# Patient Record
Sex: Male | Born: 2000
Health system: Southern US, Community
[De-identification: ages and names within clinical notes are randomized; demographics above are authoritative.]

## PROBLEM LIST (undated history)

## (undated) HISTORY — PX: TOE SURGERY: SHX1073

---
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
## 2020-01-18 DIAGNOSIS — Z23 Encounter for immunization: Secondary | ICD-10-CM | POA: Diagnosis not present

## 2020-08-14 DIAGNOSIS — Z20822 Contact with and (suspected) exposure to covid-19: Secondary | ICD-10-CM | POA: Diagnosis not present

## 2021-03-27 ENCOUNTER — Ambulatory Visit
Admission: EM | Admit: 2021-03-27 | Discharge: 2021-03-27 | Disposition: A | Discharge status: Home / Self Care | Payer: 59 | Attending: Physician Assistant | Admitting: Physician Assistant

## 2021-03-27 ENCOUNTER — Other Ambulatory Visit: Payer: Self-pay

## 2021-03-27 DIAGNOSIS — J029 Acute pharyngitis, unspecified: Secondary | ICD-10-CM | POA: Insufficient documentation

## 2021-03-27 LAB — POCT RAPID STREP A (OFFICE): Rapid Strep A Screen: NEGATIVE

## 2021-03-27 NOTE — ED Triage Notes (Signed)
Pt presents with sore throat x 3 days.  States it feels like the strep throat he's had in the past.  No other s/s incl cough, HA.

## 2021-03-27 NOTE — ED Provider Notes (Signed)
[name]    CSN: [id number] Arrival date & time: 03/27/21  1126      History   Chief Complaint Chief Complaint  Patient presents with   Sore Throat    HPI Shane Davenport is a 21 y.o. male.   Patient here today for evaluation of sore throat that started 3 days ago. He states it feels similar to prior strep infections. He has not had fever. He denies any other symptoms. He has not taken any meds for symptoms.  The history is provided by the patient.  Sore Throat Pertinent negatives include no shortness of breath.   History reviewed. No pertinent past medical history.  Patient Active Problem List   Diagnosis Date Noted   Hip injury, left, subsequent encounter 05/13/2016   Recurrent epistaxis 04/24/2016   Deviated nasal septum 04/24/2016    History reviewed. No pertinent surgical history.     Home Medications    Prior to Admission medications   Medication Sig Start Date End Date Taking? Authorizing Provider  ADDERALL XR 30 MG 24 hr capsule  04/02/18  Yes [provider]  cetirizine (ZYRTEC ALLERGY) 10 MG tablet Take 1 tablet (10 mg total) by mouth daily. 07/16/19   Hall-Potvin, [place], PA-C  fluticasone (FLONASE) 50 MCG/ACT nasal spray Place 1 spray into both nostrils daily. 07/16/19   Hall-Potvin, [place], PA-C  ibuprofen (ADVIL,MOTRIN) 200 MG tablet Take 200 mg by mouth every 6 (six) hours as needed.    [provider]    Family History Family History  Problem Relation Age of Onset   Healthy Mother    Healthy Father     Social History Social History   Tobacco Use   Smoking status: Never   Smokeless tobacco: Never  Substance Use Topics   Alcohol use: Never   Drug use: Never     Allergies   Patient has no known allergies.   Review of Systems Review of Systems  Constitutional:  Negative for chills and fever.  HENT:  Positive for sore throat. Negative for congestion.   Eyes:  Negative for discharge and redness.   Respiratory:  Negative for cough and shortness of breath.   Gastrointestinal:  Negative for nausea and vomiting.    Physical Exam Triage Vital Signs ED Triage Vitals  Enc Vitals Group     BP 03/27/21 1256 116/69     Pulse Rate 03/27/21 1256 96     Resp 03/27/21 1256 18     Temp 03/27/21 1256 98.4 F (36.9 C)     Temp Source 03/27/21 1256 Oral     SpO2 03/27/21 1256 96 %     Weight --      Height --      Head Circumference --      Peak Flow --      Pain Score 03/27/21 1252 6     Pain Loc --      Pain Edu? --      Excl. in GC? --    No data found.  Updated Vital Signs BP 116/69 (BP Location: Left Arm)    Pulse 96    Temp 98.4 F (36.9 C) (Oral)    Resp 18    SpO2 96%   Physical Exam Vitals and nursing note reviewed.  Constitutional:      General: He is not in acute distress.    Appearance: He is well-developed. He is not ill-appearing.  HENT:     Head: Normocephalic and atraumatic.  Mouth/Throat:     Mouth: Mucous membranes are dry.     Pharynx: Posterior oropharyngeal erythema present.  Cardiovascular:     Rate and Rhythm: Normal rate.  Pulmonary:     Effort: Pulmonary effort is normal.  Neurological:     Mental Status: He is alert.     UC Treatments / Results  Labs (all labs ordered are listed, but only abnormal results are displayed) Labs Reviewed  CULTURE, GROUP A STREP [facility])  POCT RAPID STREP A (OFFICE)    EKG   Radiology No results found.  Procedures Procedures (including critical care time)  Medications Ordered in UC Medications - No data to display  Initial Impression / Assessment and Plan / UC Course  I have reviewed the triage vital signs and the nursing notes.  Pertinent labs & imaging results that were available during my care of the patient were reviewed by me and considered in my medical decision making (see chart for details).   Strep test negative. Will order culture. Recommend symptomatic treatment and follow up if  symptoms fail to improve or worsen.    Final Clinical Impressions(s) / UC Diagnoses   Final diagnoses:  Acute pharyngitis, unspecified etiology   Discharge Instructions   None    ED Prescriptions   None    PDMP not reviewed this encounter.   Tomi Bamberger, PA-C 03/27/21 1437

## 2021-03-28 ENCOUNTER — Telehealth: Payer: 59 | Admitting: Physician Assistant

## 2021-03-28 ENCOUNTER — Other Ambulatory Visit (HOSPITAL_COMMUNITY): Payer: Self-pay

## 2021-03-28 DIAGNOSIS — J029 Acute pharyngitis, unspecified: Secondary | ICD-10-CM

## 2021-03-28 DIAGNOSIS — H2511 Age-related nuclear cataract, right eye: Secondary | ICD-10-CM | POA: Diagnosis not present

## 2021-03-28 MED ORDER — AMOXICILLIN 500 MG PO CAPS
500.0000 mg | ORAL_CAPSULE | Freq: Two times a day (BID) | ORAL | 0 refills | Status: AC
  Filled 2021-03-28: qty 20, 10d supply, fill #0

## 2021-03-28 NOTE — Progress Notes (Signed)
E-Visit for Sore Throat - Strep Symptoms  We are sorry that you are not feeling well.  Here is how we plan to help!  Based on what you have shared with me it is likely that you have pharyngitis.  Pharyngitis is inflammation and infection in the back of the throat.  This is an infection that can be caused by bacteria and is treated with antibiotics.  I am worried about a secondary ear infection as well. To cover everything, I have prescribed Amoxicillin 500 mg twice a day for 10 days. For throat pain, we recommend over the counter oral pain relief medications such as acetaminophen or aspirin, or anti-inflammatory medications such as ibuprofen or naproxen sodium. Topical treatments such as oral throat lozenges or sprays may be used as needed. Strep infections are not as easily transmitted as other respiratory infections, however we still recommend that you avoid close contact with loved ones, especially the very young and elderly.  Remember to wash your hands thoroughly throughout the day as this is the number one way to prevent the spread of infection and wipe down door knobs and counters with disinfectant.   Home Care: Only take medications as instructed by your medical team. Complete the entire course of an antibiotic. Do not take these medications with alcohol. A steam or ultrasonic humidifier can help congestion.  You can place a towel over your head and breathe in the steam from hot water coming from a faucet. Avoid close contacts especially the very young and the elderly. Cover your mouth when you cough or sneeze. Always remember to wash your hands.  Get Help Right Away If: You develop worsening fever or sinus pain. You develop a severe head ache or visual changes. Your symptoms persist after you have completed your treatment plan.  Make sure you Understand these instructions. Will watch your condition. Will get help right away if you are not doing well or get worse.   Thank you for  choosing an e-visit.  Your e-visit answers were reviewed by a board certified advanced clinical practitioner to complete your personal care plan. Depending upon the condition, your plan could have included both over the counter or prescription medications.  Please review your pharmacy choice. Make sure the pharmacy is open so you can pick up prescription now. If there is a problem, you may contact your provider through [employer] and have the prescription routed to another pharmacy.  Your safety is important to [place]. If you have drug allergies check your prescription carefully.   For the next 24 hours you can use MyChart to ask questions about today's visit, request a non-urgent call back, or ask for a work or school excuse. You will get an email in the next two days asking about your experience. I hope that your e-visit has been valuable and will speed your recovery.

## 2021-03-28 NOTE — Progress Notes (Signed)
I have spent 5 minutes in review of e-visit questionnaire, review and updating patient chart, medical decision making and response to patient.   Shane Propes Cody Marcina Kinnison, PA-C    

## 2021-03-29 LAB — CULTURE, GROUP A STREP (THRC)

## 2021-04-03 ENCOUNTER — Other Ambulatory Visit (HOSPITAL_COMMUNITY): Payer: Self-pay

## 2021-04-03 MED ORDER — FLUTICASONE PROPIONATE 50 MCG/ACT NA SUSP
2.0000 | Freq: Two times a day (BID) | NASAL | 0 refills | Status: DC | PRN
  Filled 2021-04-03: qty 16, 30d supply, fill #0

## 2021-04-03 MED ORDER — IPRATROPIUM BROMIDE 0.06 % NA SOLN
2.0000 | Freq: Four times a day (QID) | NASAL | 0 refills | Status: DC
  Filled 2021-04-03: qty 15, 19d supply, fill #0

## 2021-07-08 ENCOUNTER — Encounter (HOSPITAL_COMMUNITY): Payer: Self-pay | Admitting: Emergency Medicine

## 2021-07-08 ENCOUNTER — Emergency Department (HOSPITAL_COMMUNITY): Payer: 59

## 2021-07-08 ENCOUNTER — Emergency Department (HOSPITAL_COMMUNITY)
Admission: EM | Admit: 2021-07-08 | Discharge: 2021-07-08 | Disposition: A | Discharge status: Home / Self Care | Payer: 59 | Attending: Emergency Medicine | Admitting: Emergency Medicine

## 2021-07-08 DIAGNOSIS — N132 Hydronephrosis with renal and ureteral calculous obstruction: Secondary | ICD-10-CM | POA: Insufficient documentation

## 2021-07-08 DIAGNOSIS — N3289 Other specified disorders of bladder: Secondary | ICD-10-CM | POA: Diagnosis not present

## 2021-07-08 DIAGNOSIS — R109 Unspecified abdominal pain: Secondary | ICD-10-CM | POA: Diagnosis present

## 2021-07-08 DIAGNOSIS — N201 Calculus of ureter: Secondary | ICD-10-CM

## 2021-07-08 DIAGNOSIS — N130 Hydronephrosis with ureteropelvic junction obstruction: Secondary | ICD-10-CM | POA: Diagnosis not present

## 2021-07-08 LAB — URINALYSIS, ROUTINE W REFLEX MICROSCOPIC
Bacteria, UA: NONE SEEN
Bilirubin Urine: NEGATIVE
Glucose, UA: NEGATIVE mg/dL
Ketones, ur: 20 mg/dL — AB
Leukocytes,Ua: NEGATIVE
Nitrite: NEGATIVE
Protein, ur: NEGATIVE mg/dL
RBC / HPF: >50 RBC/hpf — ABNORMAL HIGH (ref 0–5)
Specific Gravity, Urine: 1.024 (ref 1.005–1.030)
pH: 5 (ref 5.0–8.0)

## 2021-07-08 LAB — BASIC METABOLIC PANEL
Anion gap: 7 (ref 5–15)
BUN: 15 mg/dL (ref 6–20)
CO2: 25 mmol/L (ref 22–32)
Calcium: 9 mg/dL (ref 8.9–10.3)
Chloride: 104 mmol/L (ref 98–111)
Creatinine, Ser: 1.17 mg/dL (ref 0.61–1.24)
GFR, Estimated: >60 mL/min (ref >60)
Glucose, Bld: 104 mg/dL — ABNORMAL HIGH (ref 70–99)
Potassium: 4.2 mmol/L (ref 3.5–5.1)
Sodium: 136 mmol/L (ref 135–145)

## 2021-07-08 LAB — CBC WITH DIFFERENTIAL/PLATELET
Abs Immature Granulocytes: 0.02 10*3/uL (ref 0.00–0.07)
Basophils Absolute: 0 10*3/uL (ref 0.0–0.1)
Basophils Relative: 0 %
Eosinophils Absolute: 0 10*3/uL (ref 0.0–0.5)
Eosinophils Relative: 0 %
HCT: 44.7 % (ref 39.0–52.0)
Hemoglobin: 14.7 g/dL (ref 13.0–17.0)
Immature Granulocytes: 0 %
Lymphocytes Relative: 9 %
Lymphs Abs: 0.8 10*3/uL (ref 0.7–4.0)
MCH: 28.3 pg (ref 26.0–34.0)
MCHC: 32.9 g/dL (ref 30.0–36.0)
MCV: 86 fL (ref 80.0–100.0)
Monocytes Absolute: 0.5 10*3/uL (ref 0.1–1.0)
Monocytes Relative: 6 %
Neutro Abs: 7.8 10*3/uL — ABNORMAL HIGH (ref 1.7–7.7)
Neutrophils Relative %: 85 %
Platelets: 248 10*3/uL (ref 150–400)
RBC: 5.2 MIL/uL (ref 4.22–5.81)
RDW: 13.4 % (ref 11.5–15.5)
WBC: 9.1 10*3/uL (ref 4.0–10.5)
nRBC: 0 % (ref 0.0–0.2)

## 2021-07-08 MED ORDER — KETOROLAC TROMETHAMINE 15 MG/ML IJ SOLN
15.0000 mg | Freq: Once | INTRAMUSCULAR | Status: AC
  Administered 2021-07-08: 15 mg via INTRAVENOUS
  Filled 2021-07-08: qty 1

## 2021-07-08 MED ORDER — OXYCODONE-ACETAMINOPHEN 5-325 MG PO TABS
2.0000 | ORAL_TABLET | Freq: Once | ORAL | Status: AC
  Administered 2021-07-08: 2 via ORAL
  Filled 2021-07-08: qty 2

## 2021-07-08 MED ORDER — ONDANSETRON HCL 4 MG/2ML IJ SOLN
4.0000 mg | Freq: Once | INTRAMUSCULAR | Status: AC
Start: 2021-07-08 — End: 2021-07-08
  Administered 2021-07-08: 4 mg via INTRAVENOUS
  Filled 2021-07-08: qty 2

## 2021-07-08 MED ORDER — LACTATED RINGERS IV BOLUS
1000.0000 mL | Freq: Once | INTRAVENOUS | Status: AC
  Administered 2021-07-08: 1000 mL via INTRAVENOUS

## 2021-07-08 NOTE — ED Provider Triage Note (Signed)
Emergency Medicine Provider Triage Evaluation Note ? ?Shane Davenport , a 21 y.o. male  was evaluated in triage.  Pt complains of right flank pain  ? ?Review of Systems  ?Positive: Back pain  ?Negative: No fever  ? ?Physical Exam  ?BP (!) 126/107 (BP Location: Left Arm)   Pulse 71   Temp 97.9 ?F (36.6 ?C) (Oral)   Resp 18   SpO2 100%  ?Gen:   Awake, no distress   ?Resp:  Normal effort  ?MSK:   Moves extremities without difficulty  ?Other:  Tender right flank ? ?Medical Decision Making  ?Medically screening exam initiated at 7:40 PM.  Appropriate orders placed.  Shane Davenport was informed that the remainder of the evaluation will be completed by another provider, this initial triage assessment does not replace that evaluation, and the importance of remaining in the ED until their evaluation is complete. ? ? ?  ?Elson Areas, [place] ?07/08/21 1941 ? ?

## 2021-07-08 NOTE — ED Provider Notes (Signed)
?[facility]-EMERGENCY DEPT ?Provider Note ? ? ?CSN: [id number] ?Arrival date & time: 07/08/21  1814 ? ?  ? ?History ? ?Chief Complaint  ?Patient presents with  ? Flank Pain  ? ? ?Shane Davenport is a 21 y.o. male. ? ? ?Flank Pain ? ? ?21 year old male with a history of nephrolithiasis presenting to the emergency department with right-sided flank pain.  The patient states that he had sudden onset sharp severe right-sided flank pain with associated nausea and vomiting.  He is try to orally hydrate at home but was unable to keep anything down.  He states that his pain feels similar to prior kidney stones.  He denies any fever or chills.  He denies any dysuria or increased urinary frequency. ? ?Home Medications ?Prior to Admission medications   ?Medication Sig Start Date End Date Taking? Authorizing Provider  ?ADDERALL XR 30 MG 24 hr capsule  04/02/18   [provider]  ?cetirizine (ZYRTEC ALLERGY) 10 MG tablet Take 1 tablet (10 mg total) by mouth daily. 07/16/19   Hall-Potvin, [place], PA-C  ?fluticasone (FLONASE) 50 MCG/ACT nasal spray Place 1 spray into both nostrils daily. 07/16/19   Hall-Potvin, [place], PA-C  ?fluticasone (FLONASE) 50 MCG/ACT nasal spray Place 1 spray into both nostrils every 12 (twelve) hours as needed. 04/02/21     ?ibuprofen (ADVIL,MOTRIN) 200 MG tablet Take 200 mg by mouth every 6 (six) hours as needed.    [provider]  ?ipratropium (ATROVENT) 0.06 % nasal spray Place 2 sprays into both nostrils 4 (four) times daily until directed to stop 04/02/21     ?   ? ?Allergies    ?Patient has no known allergies.   ? ?Review of Systems   ?Review of Systems  ?Genitourinary:  Positive for flank pain.  ?All other systems reviewed and are negative. ? ?Physical Exam ?Updated Vital Signs ?BP (!) 145/63   Pulse 69   Temp 97.9 ?F (36.6 ?C) (Oral)   Resp 18   SpO2 100%  ?Physical Exam ?Vitals and nursing note reviewed.  ?Constitutional:   ?   General: He is not in acute  distress. ?HENT:  ?   Head: Normocephalic and atraumatic.  ?Eyes:  ?   Conjunctiva/sclera: Conjunctivae normal.  ?   Pupils: Pupils are equal, round, and reactive to light.  ?Cardiovascular:  ?   Rate and Rhythm: Normal rate and regular rhythm.  ?Pulmonary:  ?   Effort: Pulmonary effort is normal. No respiratory distress.  ?Abdominal:  ?   General: There is no distension.  ?   Tenderness: There is no abdominal tenderness. There is right CVA tenderness. There is no guarding.  ?Musculoskeletal:     ?   General: No deformity or signs of injury.  ?   Cervical back: Neck supple.  ?Skin: ?   Findings: No lesion or rash.  ?Neurological:  ?   General: No focal deficit present.  ?   Mental Status: He is alert. Mental status is at baseline.  ? ? ?ED Results / Procedures / Treatments   ?Labs ?(all labs ordered are listed, but only abnormal results are displayed) ?Labs Reviewed  ?URINALYSIS, ROUTINE W REFLEX MICROSCOPIC - Abnormal; Notable for the following components:  ?    Result Value  ? Hgb urine dipstick MODERATE (*)   ? Ketones, ur 20 (*)   ? RBC / HPF >50 (*)   ? All other components within normal limits  ?CBC WITH DIFFERENTIAL/PLATELET - Abnormal; Notable for  the following components:  ? Neutro Abs 7.8 (*)   ? All other components within normal limits  ?BASIC METABOLIC PANEL - Abnormal; Notable for the following components:  ? Glucose, Bld 104 (*)   ? All other components within normal limits  ? ? ?EKG ?None ? ?Radiology ?CT Renal Stone Study ? ?Result Date: 07/08/2021 ?CLINICAL DATA:  Flank pain, kidney stone suspected EXAM: CT ABDOMEN AND PELVIS WITHOUT CONTRAST TECHNIQUE: Multidetector CT imaging of the abdomen and pelvis was performed following the standard protocol without IV contrast. RADIATION DOSE REDUCTION: This exam was performed according to the departmental dose-optimization program which includes automated exposure control, adjustment of the mA and/or kV according to patient size and/or use of iterative  reconstruction technique. COMPARISON:  None. FINDINGS: Lower chest: No acute abnormality. Hepatobiliary: No focal liver abnormality is seen. The gallbladder is unremarkable. Pancreas: Unremarkable. No pancreatic ductal dilatation or surrounding inflammatory changes. Spleen: Normal in size without focal abnormality. Adrenals/Urinary Tract: Adrenal glands are unremarkable. There is right-sided hydroureteronephrosis with mild perinephric and periureteral stranding due to an obstructing 3 mm stone at the right ureterovesicular junction. Bladder is minimally distended. Stomach/Bowel: The stomach is within normal limits. There is no evidence of bowel obstruction.The appendix is normal. Vascular/Lymphatic: No significant vascular findings are present. No enlarged abdominal or pelvic lymph nodes. Reproductive: Unremarkable. Other: No abdominal wall hernia or abnormality. No abdominopelvic ascites. Musculoskeletal: No acute or significant osseous findings. IMPRESSION: Obstructing 3 mm stone at the right ureterovesicular junction, with upstream mild right hydroureteronephrosis, perinephric and periureteral stranding. Electronically Signed   By: Caprice Renshaw M.D.   On: 07/08/2021 19:52   ? ?Procedures ?Procedures  ? ? ?Medications Ordered in ED ?Medications  ?oxyCODONE-acetaminophen (PERCOCET/ROXICET) 5-325 MG per tablet 2 tablet (2 tablets Oral Given 07/08/21 1945)  ?ketorolac (TORADOL) 15 MG/ML injection 15 mg (15 mg Intravenous Given 07/08/21 2024)  ?lactated ringers bolus 1,000 mL (1,000 mLs Intravenous New Bag/Given 07/08/21 2025)  ?ondansetron [facility]) injection 4 mg (4 mg Intravenous Given 07/08/21 2033)  ? ? ?ED Course/ Medical Decision Making/ A&P ?  ?                        ?Medical Decision Making ?Amount and/or Complexity of Data Reviewed ?Labs: ordered. ? ?Risk ?Prescription drug management. ? ? ?21 year old male with a history of nephrolithiasis presenting to the emergency department with right-sided flank pain.  The  patient states that he had sudden onset sharp severe right-sided flank pain with associated nausea and vomiting.  He is try to orally hydrate at home but was unable to keep anything down.  He states that his pain feels similar to prior kidney stones.  He denies any fever or chills.  He denies any dysuria or increased urinary frequency. ? ?Concern for nephrolithiasis.  No dysuria or increased urinary frequency.  No fevers or chills.  No CVA tenderness or right lower quadrant tenderness on exam. ? ?CT abdomen pelvis was performed which revealed a 3 mm stone at the right UPJ.  The patient had subsequent pain relief on administration of an IV fluid bolus and IV Toradol.  Suspect likely passage of his stone.  Urinalysis was without evidence of urinary tract infection.  Laboratory work-up without leukocytosis or anemia, BMP without evidence of AKI.  Patient symptomatically improved on reassessment, recommended NSAIDs continued oral hydration outpatient, follow-up with urology in clinic. ? ?Final Clinical Impression(s) / ED Diagnoses ?Final diagnoses:  ?Ureterolithiasis  ? ? ?Rx /  DC Orders ?ED Discharge Orders   ? ?      Ordered  ?  Ambulatory referral to Urology       ? 07/08/21 2146  ? ?  ?  ? ?  ? ? ?  ?Ernie Avena, MD ?07/08/21 2147 ? ?

## 2021-07-08 NOTE — ED Triage Notes (Signed)
Patient c/o R flank pain x5 hours. Family history of kidney stones per patient. ?

## 2021-07-08 NOTE — Discharge Instructions (Addendum)
You were evaluated in the Emergency Department and after careful evaluation, we did not find any emergent condition requiring admission or further testing in the hospital. ? ?Your exam/testing today was [place] for a kidney stone in the right UPJ junction.  Your pain has improved likely indicating passage of stone.  Your kidney function was normal.  Urinalysis was without evidence of urinary tract infection.  If you have persistent discomfort, recommend NSAIDs for pain control.  Continue to hydrate at home with lots of fluid intake.  Follow-up with urology outpatient. ? ?Please return to the Emergency Department if you experience any worsening of your condition.  Thank you for allowing [place] to be a part of your care. ? ?

## 2022-11-03 ENCOUNTER — Emergency Department (HOSPITAL_COMMUNITY): Payer: 59

## 2022-11-03 ENCOUNTER — Emergency Department (HOSPITAL_COMMUNITY)
Admission: EM | Admit: 2022-11-03 | Discharge: 2022-11-04 | Disposition: A | Discharge status: Home / Self Care | Payer: 59 | Attending: Emergency Medicine | Admitting: Emergency Medicine

## 2022-11-03 ENCOUNTER — Encounter (HOSPITAL_COMMUNITY): Payer: Self-pay

## 2022-11-03 ENCOUNTER — Other Ambulatory Visit: Payer: Self-pay

## 2022-11-03 DIAGNOSIS — N201 Calculus of ureter: Secondary | ICD-10-CM | POA: Diagnosis not present

## 2022-11-03 DIAGNOSIS — N200 Calculus of kidney: Secondary | ICD-10-CM | POA: Diagnosis not present

## 2022-11-03 DIAGNOSIS — R109 Unspecified abdominal pain: Secondary | ICD-10-CM | POA: Diagnosis not present

## 2022-11-03 MED ORDER — ONDANSETRON HCL 4 MG/2ML IJ SOLN
4.0000 mg | Freq: Once | INTRAMUSCULAR | Status: AC
  Administered 2022-11-04: 4 mg via INTRAVENOUS
  Filled 2022-11-03: qty 2

## 2022-11-03 MED ORDER — KETOROLAC TROMETHAMINE 15 MG/ML IJ SOLN
15.0000 mg | Freq: Once | INTRAMUSCULAR | Status: AC
  Administered 2022-11-04: 15 mg via INTRAVENOUS
  Filled 2022-11-03: qty 1

## 2022-11-03 MED ORDER — SODIUM CHLORIDE 0.9 % IV BOLUS
1000.0000 mL | Freq: Once | INTRAVENOUS | Status: AC
  Administered 2022-11-04: 1000 mL via INTRAVENOUS

## 2022-11-03 NOTE — ED Provider Notes (Signed)
[facility] EMERGENCY DEPARTMENT AT [facility] Provider Note   CSN: [id number] Arrival date & time: 11/03/22  2105     History {Add pertinent medical, surgical, social history, OB history to HPI:1} Chief Complaint  Patient presents with   Flank Pain    Shane Davenport is a 22 y.o. male.  22 year old male presents to the emergency department for evaluation of left-sided flank pain.  Pain is nonradiating and was sudden in onset.  Has been constant since 1930.  Worsening pain provoking nausea and vomiting.  He has had some emesis while in the waiting room.  States that his pain feels similar to when he was diagnosed with a kidney stone 1.5 years ago.  He never followed up with urology after this diagnosis; believes he passed this stone on his own.  No prior abdominal surgeries.  No medications taken prior to arrival.  The history is provided by the patient and a parent. No language interpreter was used.  Flank Pain       Home Medications Prior to Admission medications   Medication Sig Start Date End Date Taking? Authorizing Provider  ADDERALL XR 30 MG 24 hr capsule  04/02/18   [provider]  cetirizine (ZYRTEC ALLERGY) 10 MG tablet Take 1 tablet (10 mg total) by mouth daily. 07/16/19   Hall-Potvin, [place], PA-C  fluticasone (FLONASE) 50 MCG/ACT nasal spray Place 1 spray into both nostrils daily. 07/16/19   Hall-Potvin, [place], PA-C  fluticasone (FLONASE) 50 MCG/ACT nasal spray Place 1 spray into both nostrils every 12 (twelve) hours as needed. 04/02/21     ibuprofen (ADVIL,MOTRIN) 200 MG tablet Take 200 mg by mouth every 6 (six) hours as needed.    [provider]  ipratropium (ATROVENT) 0.06 % nasal spray Place 2 sprays into both nostrils 4 (four) times daily until directed to stop 04/02/21         Allergies    Patient has no known allergies.    Review of Systems   Review of Systems  Genitourinary:  Positive for flank pain.  Ten systems reviewed and  are negative for acute change, except as noted in the HPI.    Physical Exam Updated Vital Signs BP (!) 144/92 (BP Location: Right Arm)   Pulse 73   Temp 98.4 F (36.9 C) (Oral)   Resp 18   SpO2 97%   Physical Exam Vitals and nursing note reviewed.  Constitutional:      General: He is not in acute distress.    Appearance: He is well-developed. He is not diaphoretic.     Comments: Nontoxic-appearing and in no acute distress  HENT:     Head: Normocephalic and atraumatic.  Eyes:     General: No scleral icterus.    Conjunctiva/sclera: Conjunctivae normal.  Cardiovascular:     Rate and Rhythm: Normal rate and regular rhythm.     Pulses: Normal pulses.  Pulmonary:     Effort: Pulmonary effort is normal. No respiratory distress.     Comments: Respirations even and unlabored Abdominal:     Comments: Soft, obese abdomen.  No reproducible abdominal tenderness  Musculoskeletal:        General: Normal range of motion.     Cervical back: Normal range of motion.  Skin:    General: Skin is warm and dry.     Coloration: Skin is not pale.     Findings: No erythema or rash.  Neurological:     Mental Status: He is alert  and oriented to person, place, and time.     Coordination: Coordination normal.  Psychiatric:        Behavior: Behavior normal.     ED Results / Procedures / Treatments   Labs (all labs ordered are listed, but only abnormal results are displayed) Labs Reviewed  URINALYSIS, ROUTINE W REFLEX MICROSCOPIC    EKG None  Radiology No results found.  Procedures Procedures  {Document cardiac monitor, telemetry assessment procedure when appropriate:1}  Medications Ordered in ED Medications - No data to display  ED Course/ Medical Decision Making/ A&P   {   Click here for ABCD2, HEART and other calculatorsREFRESH Note before signing :1}                              Medical Decision Making Amount and/or Complexity of Data Reviewed Labs: ordered. Radiology:  ordered.   ***  {Document critical care time when appropriate:1} {Document review of labs and clinical decision tools ie heart score, Chads2Vasc2 etc:1}  {Document your independent review of radiology images, and any outside records:1} {Document your discussion with family members, caretakers, and with consultants:1} {Document social determinants of health affecting pt's care:1} {Document your decision making why or why not admission, treatments were needed:1} Final Clinical Impression(s) / ED Diagnoses Final diagnoses:  None    Rx / DC Orders ED Discharge Orders     None

## 2022-11-03 NOTE — ED Triage Notes (Signed)
Pt presents via POV c/o left sided flank pain and vomiting for the last few hours. Reports hx of kidney stones. Pt actively vomiting in triage.

## 2022-11-04 ENCOUNTER — Other Ambulatory Visit: Payer: Self-pay

## 2022-11-04 ENCOUNTER — Other Ambulatory Visit (HOSPITAL_COMMUNITY): Payer: Self-pay

## 2022-11-04 DIAGNOSIS — N201 Calculus of ureter: Secondary | ICD-10-CM | POA: Diagnosis not present

## 2022-11-04 DIAGNOSIS — R109 Unspecified abdominal pain: Secondary | ICD-10-CM | POA: Diagnosis not present

## 2022-11-04 MED ORDER — OXYCODONE-ACETAMINOPHEN 5-325 MG PO TABS
1.0000 | ORAL_TABLET | Freq: Once | ORAL | Status: AC
  Administered 2022-11-04: 1 via ORAL
  Filled 2022-11-04: qty 1

## 2022-11-04 MED ORDER — TAMSULOSIN HCL 0.4 MG PO CAPS
0.4000 mg | ORAL_CAPSULE | Freq: Every day | ORAL | 0 refills | Status: DC
  Filled 2022-11-04 (×2): qty 10, 10d supply, fill #0

## 2022-11-04 MED ORDER — ONDANSETRON 4 MG PO TBDP
4.0000 mg | ORAL_TABLET | Freq: Three times a day (TID) | ORAL | 0 refills | Status: DC | PRN
  Filled 2022-11-04 (×2): qty 10, 4d supply, fill #0

## 2022-11-04 MED ORDER — OXYCODONE-ACETAMINOPHEN 5-325 MG PO TABS
1.0000 | ORAL_TABLET | Freq: Four times a day (QID) | ORAL | 0 refills | Status: DC | PRN
  Filled 2022-11-04 (×2): qty 13, 4d supply, fill #0

## 2022-11-04 NOTE — Discharge Instructions (Signed)
We recommend that you take Flomax to promote stone movement.  Utilize 600mg  ibuprofen every 6 hours for pain control.  You have also been prescribed Percocet to take for management of severe pain.  Do not drive or drink alcohol after taking this medication as it may make you drowsy and impair your judgment.  If you continue to have persistent pain from your kidney stone, we advise follow-up with urology.  You may also return to the ED for new or concerning symptoms.

## 2023-01-10 IMAGING — CT CT RENAL STONE PROTOCOL
2 of 4 series · 17 of 46 positions shown, 19 images · non-contrast
Comparison: None.

CLINICAL DATA: Flank pain, kidney stone suspected



[Series 2: axial st · axial · 0.87mm/px · z∈[+1312,+1722]mm · 14 of 94 slices shown, 16 images]
[im 6/94  soft-tissue]
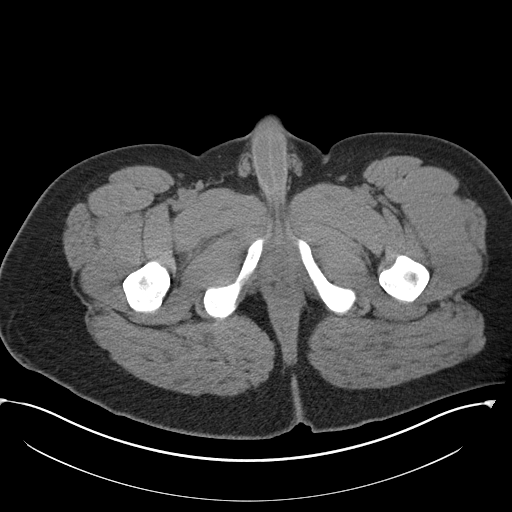
[im 6/94  bone]
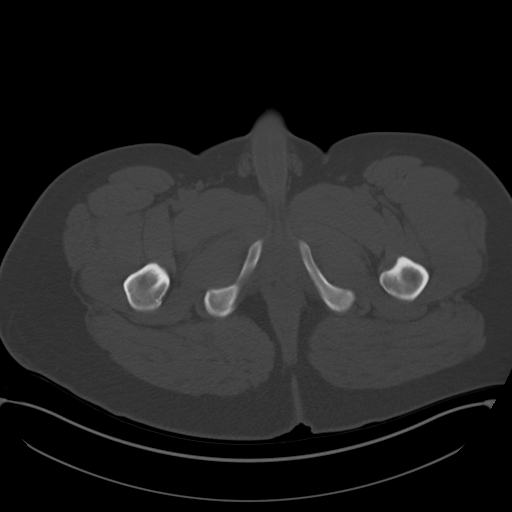
[im 11/94  soft-tissue]
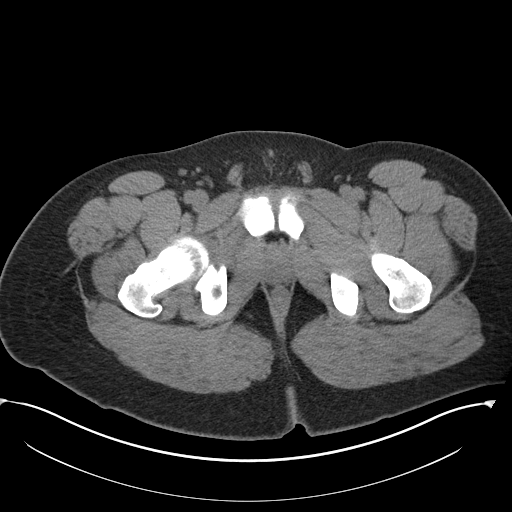
[im 21/94  soft-tissue]
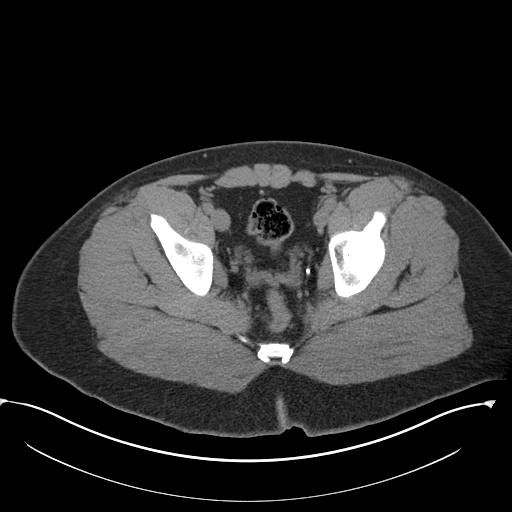
[im 26/94  soft-tissue]
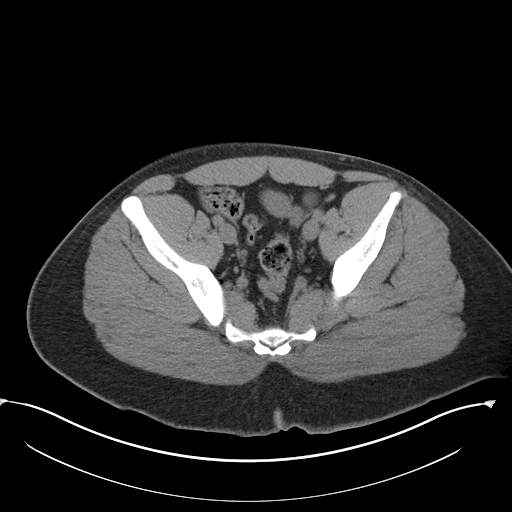
[im 32/94  soft-tissue]
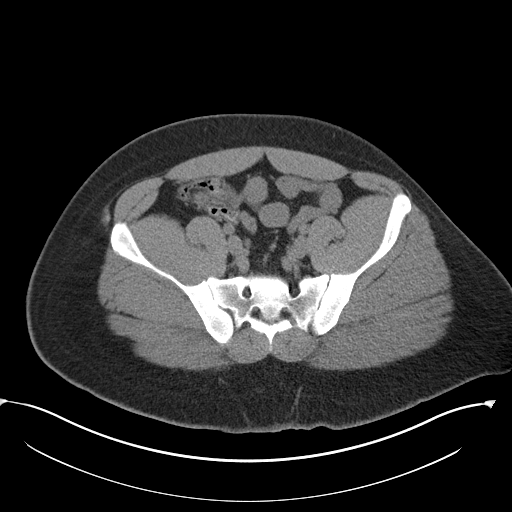
[im 37/94  soft-tissue]
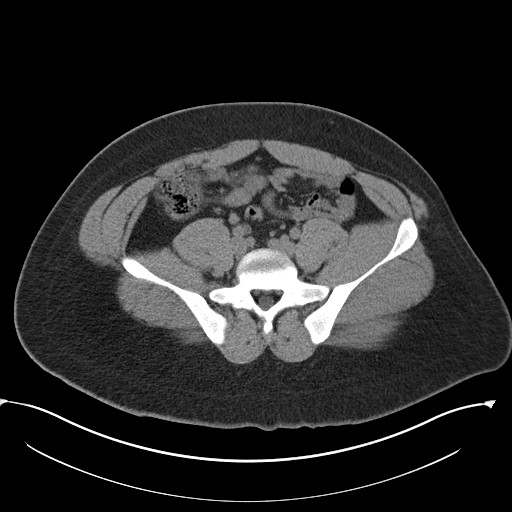
[im 42/94  soft-tissue]
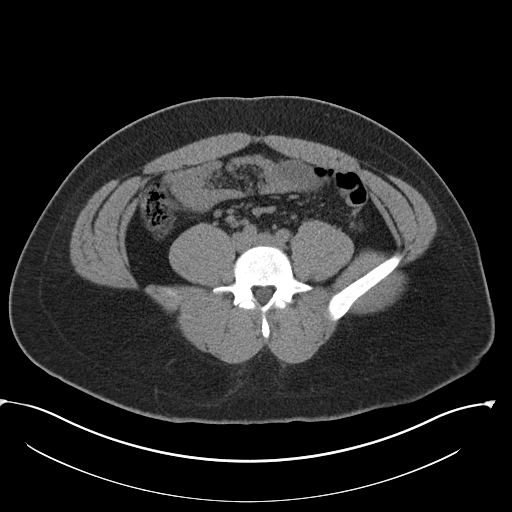
[im 52/94  soft-tissue]
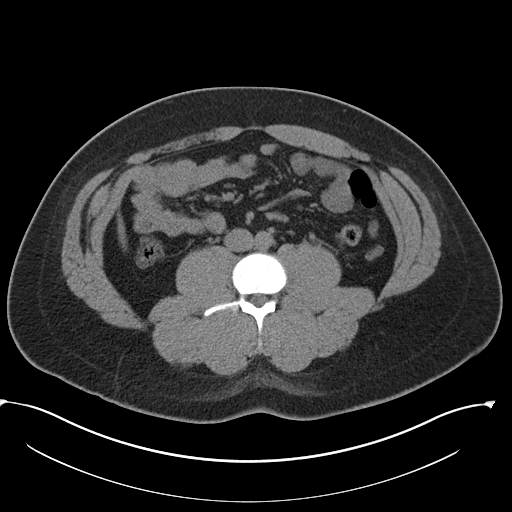
[im 57/94  soft-tissue]
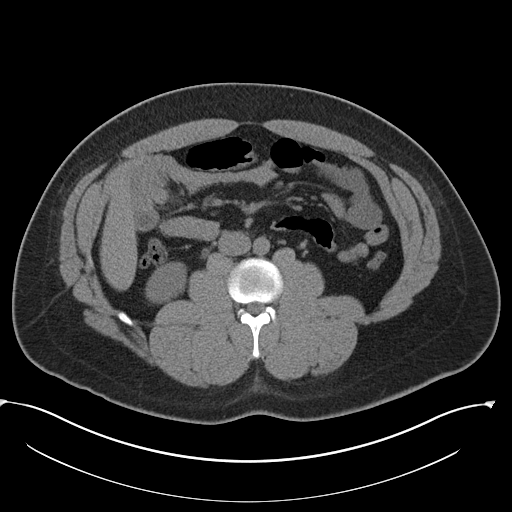
[im 57/94  bone]
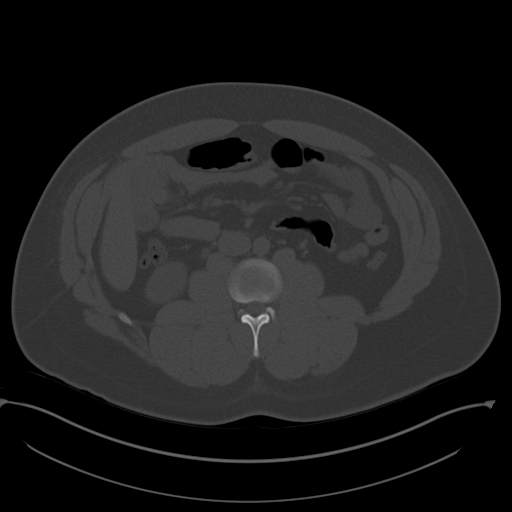
[im 63/94  soft-tissue]
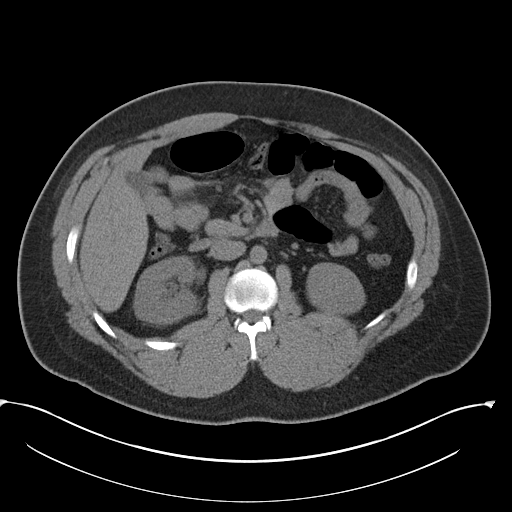
[im 68/94  soft-tissue]
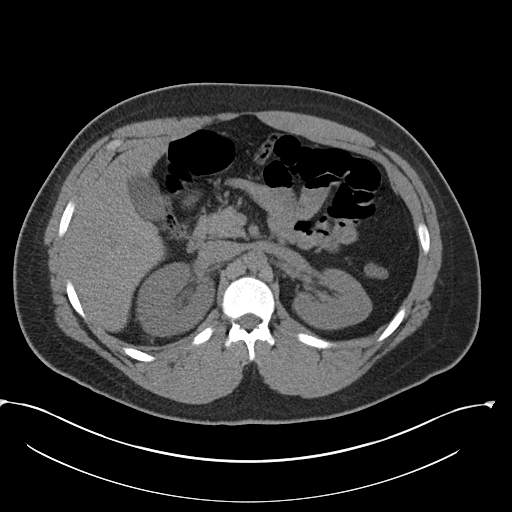
[im 73/94  soft-tissue]
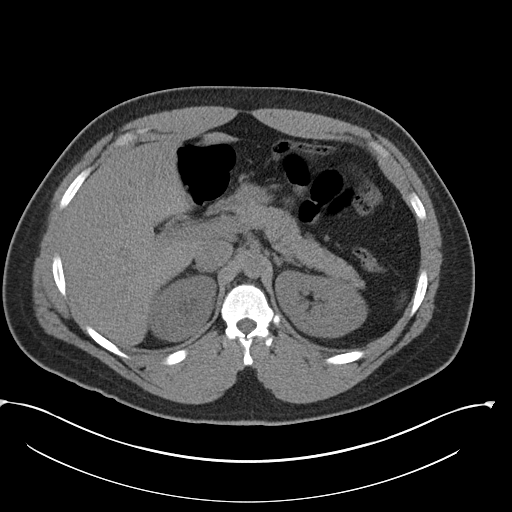
[im 83/94  soft-tissue]
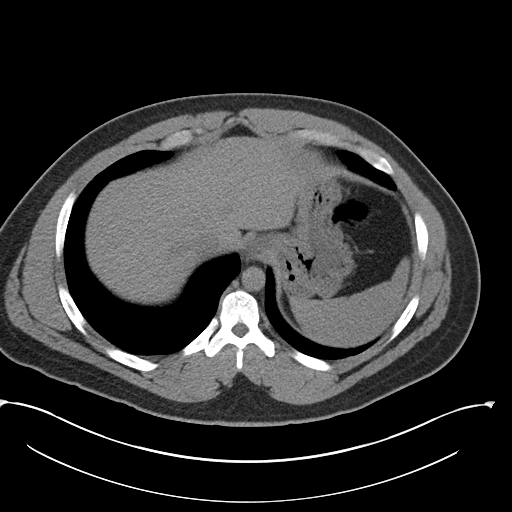
[im 88/94  soft-tissue]
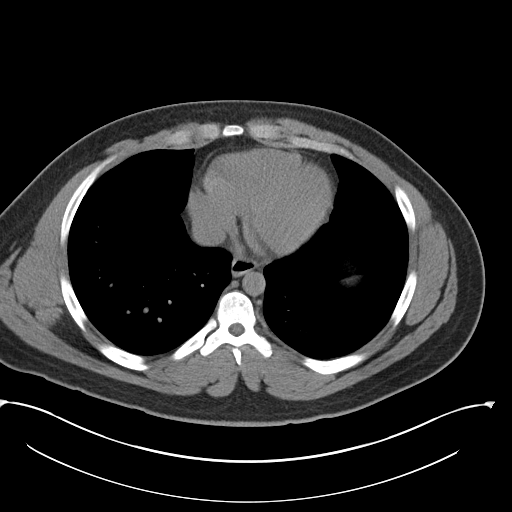

[Series 5: coronal · coronal · 0.92mm/px · 3 of 157 slices shown]
[im 53/157  soft-tissue]
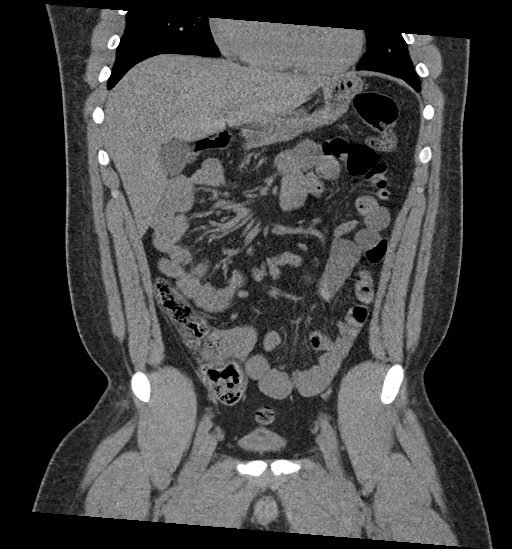
[im 70/157  soft-tissue]
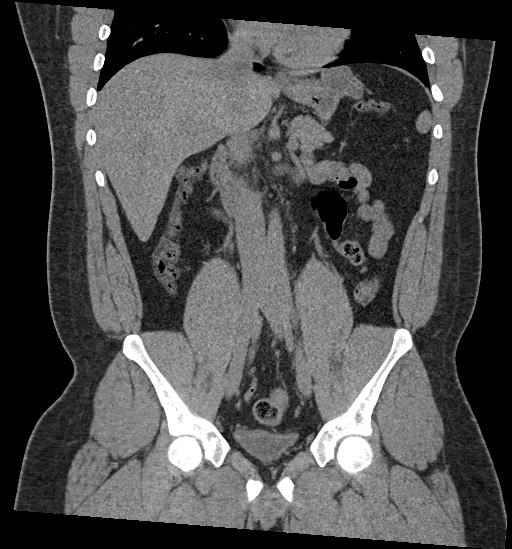
[im 87/157  soft-tissue]
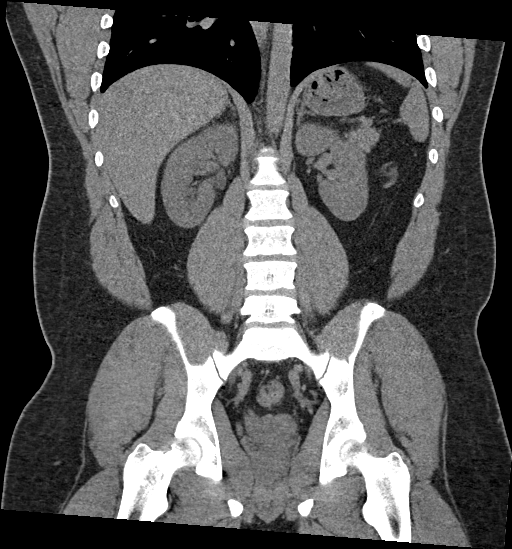

[17 of 46 positions shown; findings below may reference images not displayed]

FINDINGS: Lower chest: No acute abnormality.

Hepatobiliary: No focal liver abnormality is seen. The gallbladder
is unremarkable.

Pancreas: Unremarkable. No pancreatic ductal dilatation or
surrounding inflammatory changes.

Spleen: Normal in size without focal abnormality.

Adrenals/Urinary Tract: Adrenal glands are unremarkable. There is
right-sided hydroureteronephrosis with mild perinephric and
periureteral stranding due to an obstructing 3 mm stone at the right
ureterovesicular junction. Bladder is minimally distended.

Stomach/Bowel: The stomach is within normal limits. There is no
evidence of bowel obstruction.The appendix is normal.

Vascular/Lymphatic: No significant vascular findings are present. No
enlarged abdominal or pelvic lymph nodes.

Reproductive: Unremarkable.

Other: No abdominal wall hernia or abnormality. No abdominopelvic
ascites.

Musculoskeletal: No acute or significant osseous findings.
IMPRESSION: Obstructing 3 mm stone at the right ureterovesicular junction, with
upstream mild right hydroureteronephrosis, perinephric and
periureteral stranding.

## 2023-03-25 ENCOUNTER — Emergency Department (HOSPITAL_BASED_OUTPATIENT_CLINIC_OR_DEPARTMENT_OTHER): Payer: 59 | Admitting: Radiology

## 2023-03-25 ENCOUNTER — Emergency Department (HOSPITAL_BASED_OUTPATIENT_CLINIC_OR_DEPARTMENT_OTHER)
Admission: EM | Admit: 2023-03-25 | Discharge: 2023-03-25 | Discharge status: Against Medical Advice | Payer: 59 | Attending: Emergency Medicine | Admitting: Emergency Medicine

## 2023-03-25 ENCOUNTER — Telehealth: Payer: 59 | Admitting: Physician Assistant

## 2023-03-25 ENCOUNTER — Other Ambulatory Visit: Payer: Self-pay

## 2023-03-25 ENCOUNTER — Encounter (HOSPITAL_BASED_OUTPATIENT_CLINIC_OR_DEPARTMENT_OTHER): Payer: Self-pay | Admitting: Emergency Medicine

## 2023-03-25 DIAGNOSIS — R6889 Other general symptoms and signs: Secondary | ICD-10-CM

## 2023-03-25 DIAGNOSIS — R34 Anuria and oliguria: Secondary | ICD-10-CM

## 2023-03-25 DIAGNOSIS — R1110 Vomiting, unspecified: Secondary | ICD-10-CM | POA: Insufficient documentation

## 2023-03-25 DIAGNOSIS — Z5321 Procedure and treatment not carried out due to patient leaving prior to being seen by health care provider: Secondary | ICD-10-CM | POA: Insufficient documentation

## 2023-03-25 DIAGNOSIS — R1013 Epigastric pain: Secondary | ICD-10-CM | POA: Diagnosis not present

## 2023-03-25 DIAGNOSIS — R197 Diarrhea, unspecified: Secondary | ICD-10-CM

## 2023-03-25 DIAGNOSIS — R509 Fever, unspecified: Secondary | ICD-10-CM

## 2023-03-25 DIAGNOSIS — R079 Chest pain, unspecified: Secondary | ICD-10-CM | POA: Diagnosis not present

## 2023-03-25 LAB — COMPREHENSIVE METABOLIC PANEL
ALT: 63 U/L — ABNORMAL HIGH (ref 0–44)
AST: 42 U/L — ABNORMAL HIGH (ref 15–41)
Albumin: 4.4 g/dL (ref 3.5–5.0)
Alkaline Phosphatase: 49 U/L (ref 38–126)
Anion gap: 11 (ref 5–15)
BUN: 15 mg/dL (ref 6–20)
CO2: 27 mmol/L (ref 22–32)
Calcium: 8.5 mg/dL — ABNORMAL LOW (ref 8.9–10.3)
Chloride: 99 mmol/L (ref 98–111)
Creatinine, Ser: 1.12 mg/dL (ref 0.61–1.24)
GFR, Estimated: >60 mL/min (ref >60)
Glucose, Bld: 88 mg/dL (ref 70–99)
Potassium: 3.8 mmol/L (ref 3.5–5.1)
Sodium: 137 mmol/L (ref 135–145)
Total Bilirubin: 0.7 mg/dL (ref 0.0–1.2)
Total Protein: 7.5 g/dL (ref 6.5–8.1)

## 2023-03-25 LAB — CBC
HCT: 50.6 % (ref 39.0–52.0)
Hemoglobin: 17.1 g/dL — ABNORMAL HIGH (ref 13.0–17.0)
MCH: 29.5 pg (ref 26.0–34.0)
MCHC: 33.8 g/dL (ref 30.0–36.0)
MCV: 87.2 fL (ref 80.0–100.0)
Platelets: 214 10*3/uL (ref 150–400)
RBC: 5.8 MIL/uL (ref 4.22–5.81)
RDW: 12.5 % (ref 11.5–15.5)
WBC: 4.3 10*3/uL (ref 4.0–10.5)
nRBC: 0 % (ref 0.0–0.2)

## 2023-03-25 LAB — LIPASE, BLOOD: Lipase: 24 U/L (ref 11–51)

## 2023-03-25 LAB — TROPONIN I (HIGH SENSITIVITY): Troponin I (High Sensitivity): 7 ng/L (ref <18)

## 2023-03-25 NOTE — ED Triage Notes (Signed)
Vomiting, diarrhea x 2 days Reports last urination 2 days ago Epigastric pain Denies bladder discomfort/urge to urinate

## 2023-03-25 NOTE — Progress Notes (Signed)
 Based on what you shared with me, I feel your condition warrants further evaluation as soon as possible at an Emergency department. Due to having high fevrs, GI symptoms and having decreased urination, you are most likely becoming dehydrated and require in person evaluation.   NOTE: There will be NO CHARGE for this eVisit   If you are having a true medical emergency please call 911.      Emergency Department-[facility]  Get Driving Directions  [phone]  [address]  [place], [place] [address]  Open 24/7/365      [facility] Emergency Department at [facility]  Get Driving Directions  [address]  [place], [place] [address]  Open 24/7/365    Emergency Department- [facility]  Get Driving Directions  [phone]  [address]  [place], [place] [address]  Open 24/7/365      [facility] at [facility]  Get Driving Directions  [phone]  [address]  [place], [place] [address]  Open 24/7/365    [facility]  Emergency Department- [facility]  Get Driving Directions  [phone]  [address]  [place], [place] [address]  Open 24/7/365    [place]  Emergency Department- [facility] [place]  Get Driving Directions  [address]  [place], [place] [address]  Open 24/7/365    [facility]  Emergency Department- [facility]  Get Driving Directions  [phone]  [address]  [place], [place] [address]  Open 24/7/365     I have spent 5 minutes in review of e-visit questionnaire, review and updating patient chart, medical decision making and response to patient.   Delon CHRISTELLA Dickinson, PA-C

## 2023-08-07 ENCOUNTER — Ambulatory Visit (INDEPENDENT_AMBULATORY_CARE_PROVIDER_SITE_OTHER): Admitting: Family Medicine

## 2023-08-07 ENCOUNTER — Encounter: Payer: Self-pay | Admitting: Family Medicine

## 2023-08-07 VITALS — BP 112/61 | HR 85 | Ht 68.0 in | Wt 279.0 lb

## 2023-08-07 DIAGNOSIS — Z1331 Encounter for screening for depression: Secondary | ICD-10-CM

## 2023-08-07 DIAGNOSIS — G4739 Other sleep apnea: Secondary | ICD-10-CM | POA: Diagnosis not present

## 2023-08-07 DIAGNOSIS — Z0000 Encounter for general adult medical examination without abnormal findings: Secondary | ICD-10-CM

## 2023-08-07 DIAGNOSIS — G4700 Insomnia, unspecified: Secondary | ICD-10-CM | POA: Diagnosis not present

## 2023-08-07 NOTE — Progress Notes (Signed)
 New Patient Office Visit  Subjective    Patient ID: Llewellyn M Coffie, male    DOB: 03/19/2001  Age: 23 y.o. MRN: [id number]  CC:  Chief Complaint  Patient presents with   Establish Care    HPI LINDO GISLASON presents to establish care  Discussed the use of AI scribe software for clinical note transcription with the patient, who gave verbal consent to proceed.  History of Present Illness HUNTLEE Davenport is a 23 year old male who presents with insomnia and mood changes.  He experiences insomnia characterized by difficulty falling asleep and frequent awakenings throughout the night. He attempts to sleep around 10:30 PM but does not fall asleep until midnight or later, waking up multiple times during the night, often due to his dog or naturally. He estimates sleeping for about three to four hours per night and feels sleepy during the daytime.  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 He denies any thoughts of self-harm but acknowledges feeling stressed and anxious. He has tried over-the-counter sleep aids such as Z-Quil and melatonin, which provide limited relief, allowing him to sleep for four to five hours but not preventing frequent awakenings.  He lives with his parents and reports snoring but denies any witnessed apneas or waking up gasping for air. His family history includes a relative who is believed to have sleep apnea but has not been tested. No history of high blood pressure and no known allergies. He is not currently taking any medications.      STOP-BANG for SLEEP APNEA Do you Snore loudly? Yes Do you often feel Tired during day? Yes Has anyone Observed you stop breathing? No History of high blood Pressure? No BMI >35? Yes Age >50? No Neck circumference >16 in? Yes Gender male? Yes 5-8 = high risk 3-4 = intermediate 0-2 = low risk       Outpatient Encounter Medications as of 08/07/2023  Medication  Sig   [DISCONTINUED] ondansetron  (ZOFRAN -ODT) 4 MG disintegrating tablet Dissolve 1 tablet (4 mg) by mouth every 8 hours as needed for nausea or vomiting.   [DISCONTINUED] oxyCODONE -acetaminophen  (PERCOCET/ROXICET) 5-325 MG tablet Take 1 tablet by mouth every 6 hours as needed for severe pain.   [DISCONTINUED] tamsulosin  (FLOMAX ) 0.4 MG CAPS capsule Take 1 capsule by mouth daily.   No facility-administered encounter medications on file as of 08/07/2023.    History reviewed. No pertinent past medical history.  Past Surgical History:  Procedure Laterality Date   TOE SURGERY      Family History  Problem Relation Age of Onset   Healthy Mother    Healthy Father     Social History   Socioeconomic History   Marital status: Single    Spouse name: Not on file   Number of children: Not on file   Years of education: Not on file   Highest education level: Not on file  Occupational History   Not on file  Tobacco Use   Smoking status: Never   Smokeless tobacco: Never  Substance and Sexual Activity   Alcohol use: Never   Drug use: Never   Sexual activity: Never  Other Topics Concern   Not on file  Social History Narrative   Not on file   Social Drivers of Health   Financial Resource Strain: Not on file  Food Insecurity: Not on file  Transportation Needs: Not on file  Physical Activity: Not on file  Stress:  Not on file  Social Connections: Not on file  Intimate Partner Violence: Not on file    ROS All review of systems negative except what is listed in the HPI      Objective    BP 112/61   Pulse 85   Ht 5\' 8"  (1.727 m)   Wt 279 lb (126.6 kg)   SpO2 98%   BMI 42.42 kg/m   Physical Exam Vitals reviewed.  Constitutional:      Appearance: Normal appearance. He is obese.  Cardiovascular:     Rate and Rhythm: Normal rate and regular rhythm.     Heart sounds: Normal heart sounds.  Pulmonary:     Effort: Pulmonary effort is normal.     Breath sounds: Normal  breath sounds.  Skin:    General: Skin is warm and dry.  Neurological:     Mental Status: He is alert and oriented to person, place, and time.  Psychiatric:        Mood and Affect: Mood normal.        Behavior: Behavior normal.        Thought Content: Thought content normal.        Judgment: Judgment normal.             Assessment & Plan:   Problem List Items Addressed This Visit   None Visit Diagnoses       Sleep apnea-like behavior    -  Primary   Relevant Orders   Ambulatory referral to Sleep Studies     Encounter for medical examination to establish care         Insomnia, unspecified type         Screening for depression           Assessment & Plan Insomnia Chronic insomnia with difficulty initiating sleep and frequent nocturnal awakenings, exacerbated by stress and potential anxiety. Prefers to await sleep study results before pharmacotherapy. - Recommend sleep hygiene: melatonin 30 minutes before bedtime, hot shower, soft music, reading a calming book. - PHQ/GAD reviewed, he feels most symptoms are related to recently starting a business also triggering some sleep issues. No SI/HI  Possible sleep apnea Symptoms and family history suggest possible sleep apnea.  - Referral placed  Elevated liver enzymes Elevated liver enzymes noted during previous hospitalization for kidney stone. - Recheck liver enzymes during next physical examination. Declined labs today     Return for physical at your convenience .   Everlina Hock, NP

## 2023-08-14 ENCOUNTER — Other Ambulatory Visit: Payer: Self-pay

## 2023-08-14 DIAGNOSIS — Z72821 Inadequate sleep hygiene: Secondary | ICD-10-CM | POA: Diagnosis not present

## 2023-08-14 DIAGNOSIS — G4700 Insomnia, unspecified: Secondary | ICD-10-CM | POA: Diagnosis not present

## 2023-08-14 DIAGNOSIS — E662 Morbid (severe) obesity with alveolar hypoventilation: Secondary | ICD-10-CM | POA: Diagnosis not present

## 2023-08-14 DIAGNOSIS — G4733 Obstructive sleep apnea (adult) (pediatric): Secondary | ICD-10-CM | POA: Diagnosis not present

## 2023-08-14 MED ORDER — TRAZODONE HCL 50 MG PO TABS
25.0000 mg | ORAL_TABLET | Freq: Every day | ORAL | 1 refills | Status: AC
  Filled 2023-08-14: qty 30, 30d supply, fill #0

## 2023-08-19 ENCOUNTER — Encounter: Payer: Self-pay | Admitting: Family Medicine

## 2023-08-19 DIAGNOSIS — G4700 Insomnia, unspecified: Secondary | ICD-10-CM | POA: Insufficient documentation

## 2023-09-22 DIAGNOSIS — G4700 Insomnia, unspecified: Secondary | ICD-10-CM | POA: Diagnosis not present

## 2023-09-22 DIAGNOSIS — F419 Anxiety disorder, unspecified: Secondary | ICD-10-CM | POA: Diagnosis not present

## 2023-09-22 DIAGNOSIS — R0683 Snoring: Secondary | ICD-10-CM | POA: Diagnosis not present

## 2023-09-22 DIAGNOSIS — E662 Morbid (severe) obesity with alveolar hypoventilation: Secondary | ICD-10-CM | POA: Diagnosis not present
